# Patient Record
Sex: Male | Born: 1990 | Race: White | Hispanic: No | Marital: Single
Health system: Southern US, Community
[De-identification: ages and names within clinical notes are randomized; demographics above are authoritative.]

---
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
## 2012-01-26 ENCOUNTER — Ambulatory Visit: Payer: Self-pay | Admitting: Unknown Physician Specialty

## 2012-01-30 LAB — WOUND CULTURE

## 2012-12-18 IMAGING — CR RIGHT HAND - COMPLETE 3+ VIEW
1 series · 3 of 3 positions shown · non-contrast
Comparison: none

REASON FOR EXAM: pain
COMMENTS:   May transport without cardiac monitor

[Series 1: pa · 0.17mm/px · 3 of 3 slices shown]
[im 1/3]
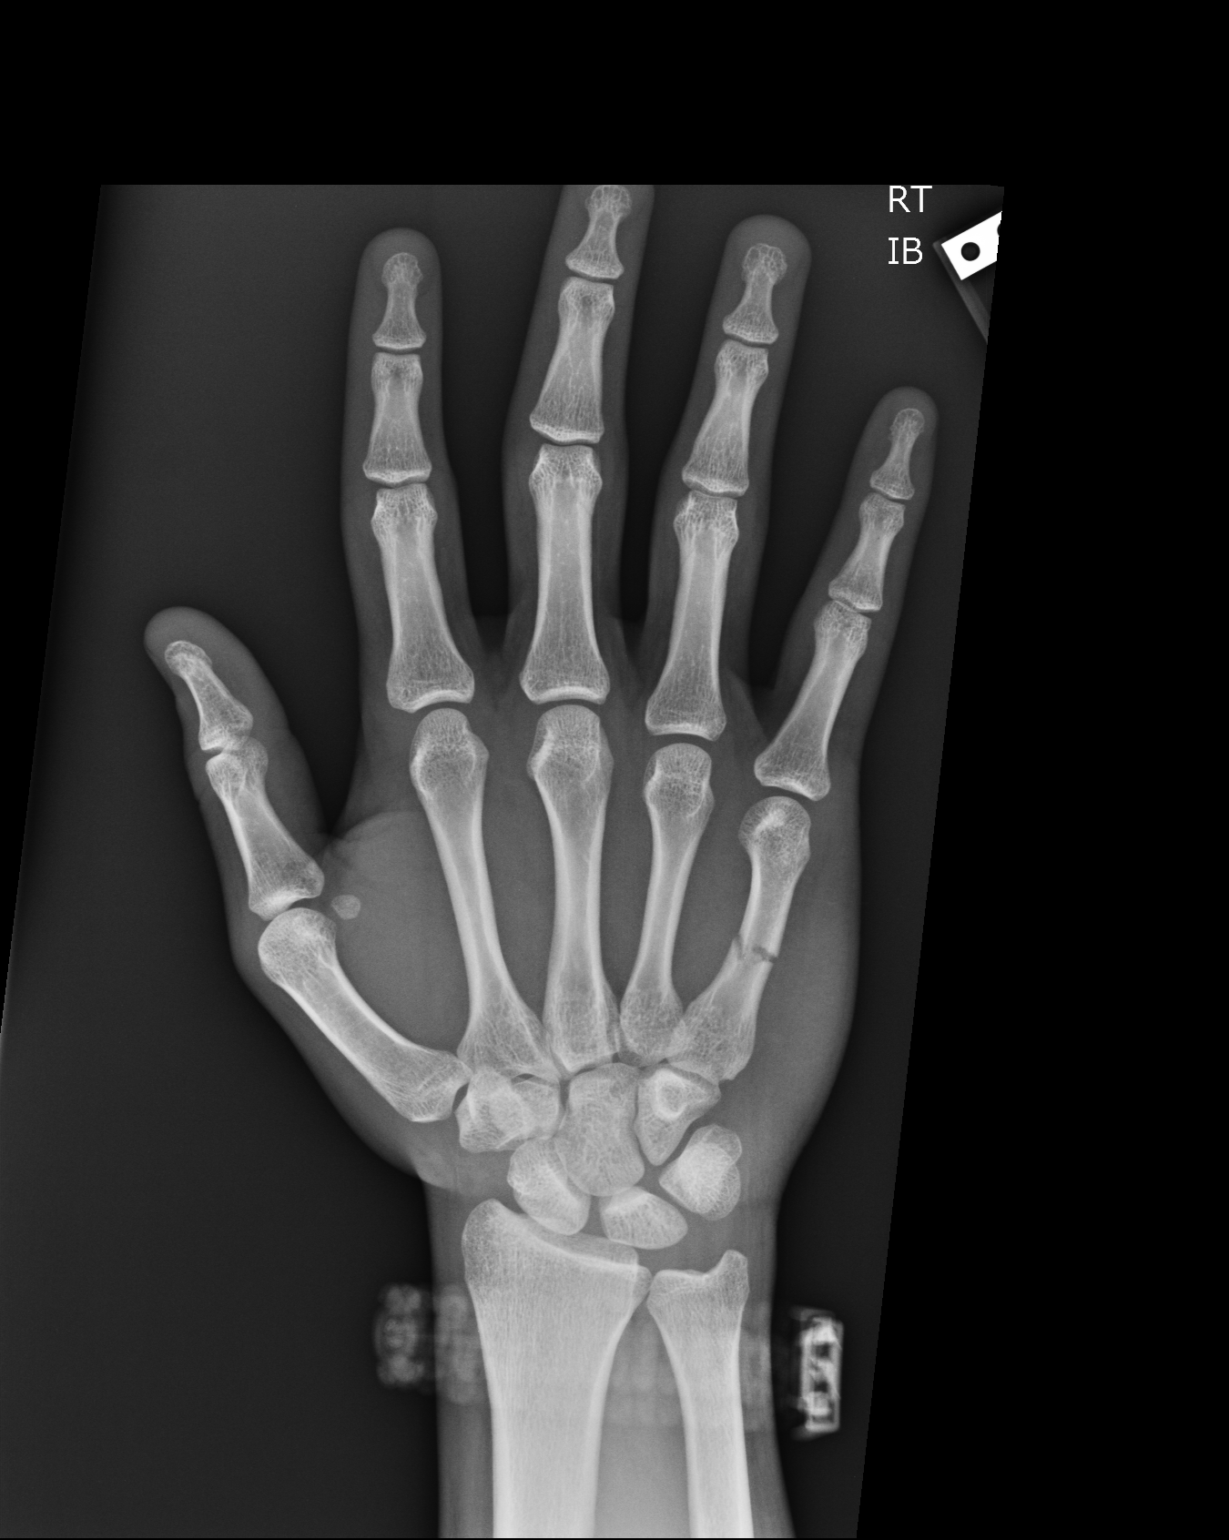
[im 2/3]
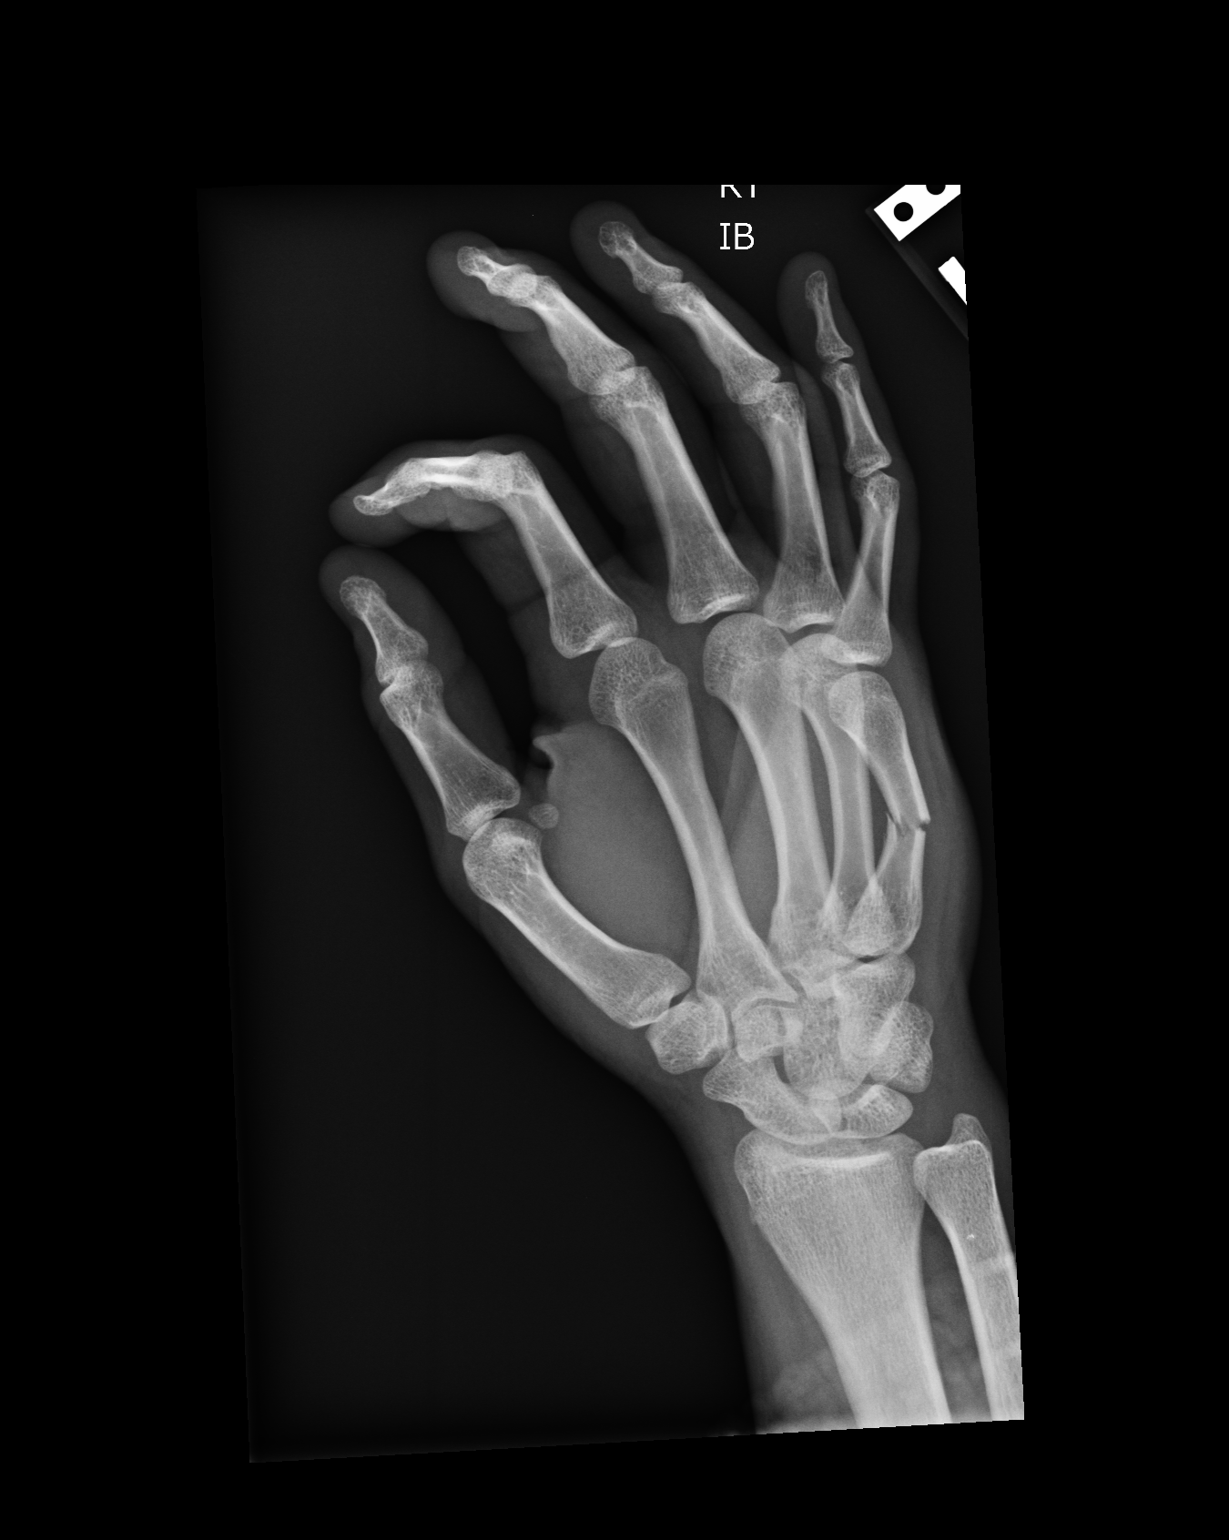
[im 3/3]
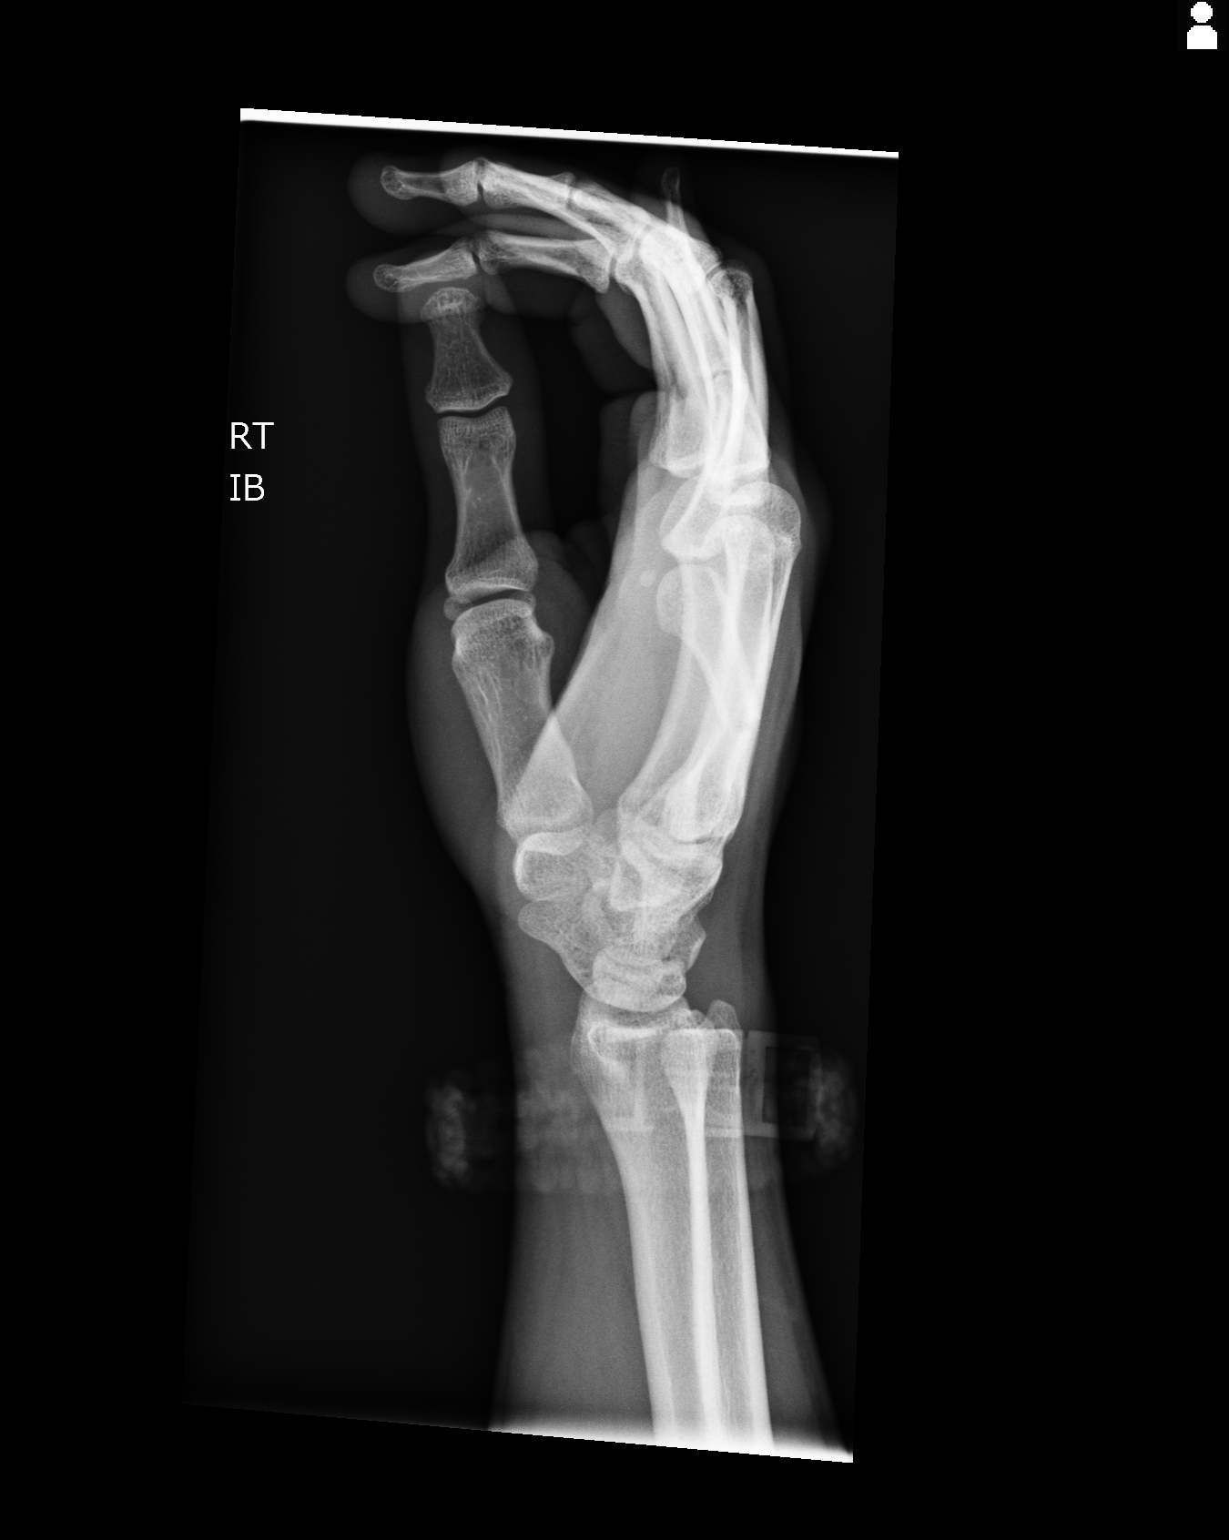

[3 of 3 positions shown; findings below may reference images not displayed]

PROCEDURE:     DXR - DXR HAND RT COMPLETE W/OBLIQUES  - December 14, 2011  [DATE]

RESULT:     Images of the right hand demonstrate a transverse fracture in
the midportion of the right fifth metacarpal with dorsal angulation. No
significant comminution is seen. The bony structures otherwise unremarkable.
IMPRESSION: 1. Left fifth metacarpal fracture.

## 2015-03-15 NOTE — Op Note (Signed)
PATIENT NAME:  Bobby PitterMILES, Caley Z MR#:  [id number] DATE OF BIRTH:  [birth year]  DATE OF PROCEDURE:  01/26/2012  PREOPERATIVE DIAGNOSIS: Symptomatic retained wire, right hand.   POSTOPERATIVE DIAGNOSIS: Symptomatic retained wire, right hand.  PROCEDURE PERFORMED: Removal of wire, right hand, deep.   SURGEON: Winn JockJames C. Tyland Klemens, MD   ASSISTANT: None.   ANESTHESIA: MAC.   ESTIMATED BLOOD LOSS: Minimal.   COMPLICATIONS: None.   BRIEF CLINICAL NOTE AND PATHOLOGY: The patient had had intramedullary pinning of a fifth metacarpal fracture. The pin was planned to be removed. The fracture healed nicely. He developed some drainage in the area of the pin. It was removed without complication. A culture was obtained at the time.   PROCEDURE: Antibiotics were held. Adequate anesthesia, supine position, routine prepping and draping. Appropriate time-out was called. The area of the insertion was opened with a small incision and surrounding tissue was excised. There was some mild purulence. This was cultured.   The neurovascular structures and soft tissue including the extensor tendons were carefully protected and separated. The pin was then removed. The area was thoroughly irrigated. The incision was loosely approximated with Monocryl suture. Soft sterile dressing was applied. The patient was awakened and taken to the PAC-U having tolerated the procedure well.   ____________________________ Winn JockJames C. Gerrit Heckaliff, MD jcc:drc D: 01/27/2012 06:31:07 ET T: 01/27/2012 08:38:50 ET JOB#: [id number] Winn JockJAMES C Abigail Teall MD ELECTRONICALLY SIGNED 01/31/2012 8:25

## 2015-03-15 NOTE — Op Note (Signed)
PATIENT NAME:  Bobby PitterMILES, Curlee Z MR#:  [id number] DATE OF BIRTH:  1991/11/16  DATE OF PROCEDURE:  12/20/2011  PREOPERATIVE DIAGNOSIS: Angulated right fifth metacarpal fracture.   POSTOPERATIVE DIAGNOSIS: Angulated right fifth metacarpal fracture.   PROCEDURE: Open fixation of right fifth metacarpal fracture with intramedullary wire.   SURGEON: Winn JockJames C. Jaron Czarnecki, MD  ASSISTANT: None.   ANESTHESIA: General.   ESTIMATED BLOOD LOSS: 25 mL due to large vein.   COMPLICATIONS: None.   IMPLANTS USED: 1.5 mm wire with inserter.   BRIEF CLINICAL NOTE AND PATHOLOGY: The patient suffered the above-mentioned fracture. He presented to the office, options, risks and benefits were discussed. The fracture was unstable. Patient elected to proceed with operative intervention. At time of the procedure, the fracture reduced essentially anatomically. The wire had excellent fixation.   DESCRIPTION OF PROCEDURE: Adequate general anesthesia, supine position, routine prepping and draping. Appropriate timeout was called. The fracture was examined. A small incision was made at the base of the metacarpal. The awl was used with fluoroscopic guidance advanced into the canal. The wire was then advanced across the fracture site, this was done with the fracture being held in a reduced position. AP, lateral, and oblique views showed good positioning. The wire was bent and then cut above the skin, the end cap was placed. It was rotated to try to avoid irritation. The skin was closed with Monocryl. Area was injected with plain Marcaine. Soft sterile dressing was applied followed by a splint.    The patient was awakened, taken to the postanesthesia care unit having tolerated the procedure well.    ____________________________ Winn JockJames C. Gerrit Heckaliff, MD jcc:cms D: 12/20/2011 11:58:04 ET T: 12/20/2011 12:23:36 ET JOB#: [id number]  cc: Winn JockJames C. Gerrit Heckaliff, MD, <Dictator>  Winn JockJAMES C Brittnei Jagiello MD ELECTRONICALLY SIGNED 12/21/2011 5:56

## 2020-02-21 ENCOUNTER — Ambulatory Visit: Payer: Self-pay | Attending: Internal Medicine

## 2020-02-21 DIAGNOSIS — Z23 Encounter for immunization: Secondary | ICD-10-CM

## 2020-02-21 NOTE — Progress Notes (Signed)
   Covid-19 Vaccination Clinic  Name:  Bobby Day    MRN: [id number] DOB: [birth year]  02/21/2020  Mr. Zenon was observed post Covid-19 immunization for 15 minutes without incident. He was provided with Vaccine Information Sheet and instruction to access the V-Safe system.   Mr. Brinkmeier was instructed to call 911 with any severe reactions post vaccine: Marland Kitchen Difficulty breathing  . Swelling of face and throat  . A fast heartbeat  . A bad rash all over body  . Dizziness and weakness   Immunizations Administered    Name Date Dose VIS Date Route   Pfizer COVID-19 Vaccine 02/21/2020  3:31 PM 0.3 mL 11/01/2019 Intramuscular   Manufacturer: [employer], [employer]   Lot: [id number]   NDC: [address]-7

## 2020-03-14 ENCOUNTER — Ambulatory Visit: Payer: Self-pay | Attending: Internal Medicine

## 2020-03-14 DIAGNOSIS — Z23 Encounter for immunization: Secondary | ICD-10-CM

## 2020-03-14 NOTE — Progress Notes (Signed)
   Covid-19 Vaccination Clinic  Name:  Bobby Day    MRN: [id number] DOB: Mar 31, 1991  03/14/2020  [name] was observed post Covid-19 immunization for 15 minutes without incident. He was provided with Vaccine Information Sheet and instruction to access the V-Safe system.   [name] was instructed to call 911 with any severe reactions post vaccine: Marland Kitchen Difficulty breathing  . Swelling of face and throat  . A fast heartbeat  . A bad rash all over body  . Dizziness and weakness   Immunizations Administered    Name Date Dose VIS Date Route   Pfizer COVID-19 Vaccine 03/14/2020  3:41 PM 0.3 mL 01/15/2019 Intramuscular   Manufacturer: [employer], [employer]   Lot: [id number]   NDC: [address]-0
